# Patient Record
Sex: Male | Born: 1990 | Smoking: Never smoker
Health system: Southern US, Community
[De-identification: ages and names within clinical notes are randomized; demographics above are authoritative.]

## PROBLEM LIST (undated history)

## (undated) DIAGNOSIS — R569 Unspecified convulsions: Secondary | ICD-10-CM

---

## 2019-10-19 ENCOUNTER — Observation Stay (HOSPITAL_COMMUNITY)
Admission: EM | Admit: 2019-10-19 | Discharge: 2019-10-20 | Disposition: A | Discharge status: Home / Self Care | Payer: Self-pay | Attending: Family Medicine | Admitting: Family Medicine

## 2019-10-19 ENCOUNTER — Encounter (HOSPITAL_COMMUNITY): Payer: Self-pay | Admitting: Emergency Medicine

## 2019-10-19 ENCOUNTER — Other Ambulatory Visit: Payer: Self-pay

## 2019-10-19 ENCOUNTER — Emergency Department (HOSPITAL_COMMUNITY): Payer: Self-pay

## 2019-10-19 ENCOUNTER — Observation Stay (HOSPITAL_COMMUNITY): Payer: Self-pay

## 2019-10-19 DIAGNOSIS — R55 Syncope and collapse: Secondary | ICD-10-CM

## 2019-10-19 DIAGNOSIS — Z79899 Other long term (current) drug therapy: Secondary | ICD-10-CM | POA: Insufficient documentation

## 2019-10-19 DIAGNOSIS — Z87898 Personal history of other specified conditions: Secondary | ICD-10-CM

## 2019-10-19 DIAGNOSIS — E1165 Type 2 diabetes mellitus with hyperglycemia: Secondary | ICD-10-CM

## 2019-10-19 DIAGNOSIS — E119 Type 2 diabetes mellitus without complications: Principal | ICD-10-CM | POA: Insufficient documentation

## 2019-10-19 DIAGNOSIS — R569 Unspecified convulsions: Secondary | ICD-10-CM

## 2019-10-19 DIAGNOSIS — Z20822 Contact with and (suspected) exposure to covid-19: Secondary | ICD-10-CM | POA: Insufficient documentation

## 2019-10-19 HISTORY — DX: Unspecified convulsions: R56.9

## 2019-10-19 LAB — COMPREHENSIVE METABOLIC PANEL
ALT: 49 U/L — ABNORMAL HIGH (ref 0–44)
AST: 37 U/L (ref 15–41)
Albumin: 3.8 g/dL (ref 3.5–5.0)
Alkaline Phosphatase: 46 U/L (ref 38–126)
Anion gap: 13 (ref 5–15)
BUN: 7 mg/dL (ref 6–20)
CO2: 22 mmol/L (ref 22–32)
Calcium: 9.7 mg/dL (ref 8.9–10.3)
Chloride: 102 mmol/L (ref 98–111)
Creatinine, Ser: 0.91 mg/dL (ref 0.61–1.24)
GFR calc Af Amer: >60 mL/min (ref >60)
GFR calc non Af Amer: >60 mL/min (ref >60)
Glucose, Bld: 215 mg/dL — ABNORMAL HIGH (ref 70–99)
Potassium: 4.1 mmol/L (ref 3.5–5.1)
Sodium: 137 mmol/L (ref 135–145)
Total Bilirubin: 0.6 mg/dL (ref 0.3–1.2)
Total Protein: 7.4 g/dL (ref 6.5–8.1)

## 2019-10-19 LAB — CBC WITH DIFFERENTIAL/PLATELET
Abs Immature Granulocytes: 0.02 10*3/uL (ref 0.00–0.07)
Basophils Absolute: 0 10*3/uL (ref 0.0–0.1)
Basophils Relative: 0 %
Eosinophils Absolute: 0.1 10*3/uL (ref 0.0–0.5)
Eosinophils Relative: 2 %
HCT: 47.7 % (ref 39.0–52.0)
Hemoglobin: 15.7 g/dL (ref 13.0–17.0)
Immature Granulocytes: 0 %
Lymphocytes Relative: 40 %
Lymphs Abs: 2 10*3/uL (ref 0.7–4.0)
MCH: 28.9 pg (ref 26.0–34.0)
MCHC: 32.9 g/dL (ref 30.0–36.0)
MCV: 87.7 fL (ref 80.0–100.0)
Monocytes Absolute: 0.4 10*3/uL (ref 0.1–1.0)
Monocytes Relative: 7 %
Neutro Abs: 2.5 10*3/uL (ref 1.7–7.7)
Neutrophils Relative %: 51 %
Platelets: 309 10*3/uL (ref 150–400)
RBC: 5.44 MIL/uL (ref 4.22–5.81)
RDW: 12.3 % (ref 11.5–15.5)
WBC: 5.1 10*3/uL (ref 4.0–10.5)
nRBC: 0 % (ref 0.0–0.2)

## 2019-10-19 LAB — SARS CORONAVIRUS 2 BY RT PCR (HOSPITAL ORDER, PERFORMED IN ~~LOC~~ HOSPITAL LAB): SARS Coronavirus 2: NEGATIVE

## 2019-10-19 LAB — ETHANOL: Alcohol, Ethyl (B): <10 mg/dL (ref <10)

## 2019-10-19 LAB — CBG MONITORING, ED: Glucose-Capillary: 250 mg/dL — ABNORMAL HIGH (ref 70–99)

## 2019-10-19 LAB — VALPROIC ACID LEVEL: Valproic Acid Lvl: 51 ug/mL (ref 50.0–100.0)

## 2019-10-19 MED ORDER — SODIUM CHLORIDE 0.9 % IV SOLN
75.0000 mL/h | INTRAVENOUS | Status: DC
  Administered 2019-10-19 – 2019-10-20 (×2): 75 mL/h via INTRAVENOUS

## 2019-10-19 MED ORDER — VALPROATE SODIUM 500 MG/5ML IV SOLN
1500.0000 mg | Freq: Once | INTRAVENOUS | Status: DC
  Filled 2019-10-19: qty 15

## 2019-10-19 MED ORDER — SODIUM CHLORIDE 0.9 % IV BOLUS
1000.0000 mL | Freq: Once | INTRAVENOUS | Status: AC
  Administered 2019-10-19: 1000 mL via INTRAVENOUS

## 2019-10-19 MED ORDER — VALPROATE SODIUM 500 MG/5ML IV SOLN
1500.0000 mg | Freq: Once | INTRAVENOUS | Status: AC
  Administered 2019-10-19: 1500 mg via INTRAVENOUS
  Filled 2019-10-19: qty 15

## 2019-10-19 MED ORDER — METOCLOPRAMIDE HCL 5 MG/ML IJ SOLN
10.0000 mg | Freq: Once | INTRAMUSCULAR | Status: AC
  Administered 2019-10-19: 10 mg via INTRAVENOUS
  Filled 2019-10-19: qty 2

## 2019-10-19 MED ORDER — INSULIN ASPART 100 UNIT/ML ~~LOC~~ SOLN: 0.0000 [IU] | Freq: Three times a day (TID) | SUBCUTANEOUS | Status: DC

## 2019-10-19 MED ORDER — VALPROIC ACID 250 MG PO CAPS
750.0000 mg | ORAL_CAPSULE | Freq: Two times a day (BID) | ORAL | Status: DC
  Administered 2019-10-20: 750 mg via ORAL
  Filled 2019-10-19: qty 3

## 2019-10-19 MED ORDER — GADOBUTROL 1 MMOL/ML IV SOLN
10.0000 mL | Freq: Once | INTRAVENOUS | Status: AC | PRN
  Administered 2019-10-19: 10 mL via INTRAVENOUS

## 2019-10-19 MED ORDER — ENOXAPARIN SODIUM 40 MG/0.4ML ~~LOC~~ SOLN
40.0000 mg | SUBCUTANEOUS | Status: DC
  Administered 2019-10-19: 40 mg via SUBCUTANEOUS
  Filled 2019-10-19: qty 0.4

## 2019-10-19 NOTE — Care Plan (Signed)
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  Per ED PA, patient is already back to baseline.  However he reported headache for the last few days for which he went to [facility] and had "brain imaging" done which did not show any abnormality (we do not have those images available for review).  He currently denies any neck stiffness, is afebrile.   Recommendations -After review of above history, I recommended obtaining an MRI brain with and without contrast to look for any acute abnormality.  Low suspicion for meningitis/encephalitis at this point therefore unless MRI brain shows anything concerning, patient would most likely not need a lumbar puncture. -Also recommended checking valproic acid level which was already ordered.  If level is subtherapeutic, consider increasing dose.  However if patient is already therapeutic, do not necessarily feel that adding a second medication is necessary at this point as patient has been seizure-free for many years.  However if patient has another breakthrough seizures, that would be best to add a second AED at that point. -EEG at this point is not needed as patient already has a history of epilepsy and is back to baseline so low suspicion for subclinical seizures. -Also recommend seizure precautions including do not drive till patient is seizure-free for 6 months. -Please call neurology for formal consult if needed.   Annalei Friesz Annabelle Harman

## 2019-10-19 NOTE — ED Triage Notes (Signed)
Pt BIB [facility] from work. Pt went to restroom and had a seizure while in the restroom. Pt postictal upon EMS arrival. A&Ox4. Pt with history of seizures. NAD. VSS.

## 2019-10-19 NOTE — Progress Notes (Signed)
Patient kept turning head and would not stay awake for exam.  Kept falling asleep and turning head.  Nurse notified and images obtained sent.

## 2019-10-19 NOTE — H&P (Addendum)
Family Medicine Teaching [facility] Admission History and Physical Service Pager: [phone]  Patient name: Ethan Edwards Medical record number: [id number] Date of birth: [birth year] Age: 29 y.o. Gender: male  Primary Care Provider: No primary care provider on file. Consultants: Neurology Code Status: Partial code Preferred Emergency Contact:   Chief Complaint: Usually activity  Assessment and Plan: Ethan Edwards is a 29 y.o. male presenting with seizure-like activity.  PMH is significant for seizure disorder.  Seizure-like activity vs syncope Patient reportedly went to the bathroom while at work in a [employer].  He reports he felt hot and passed out.  Unsure if he had any tonic-clonic motions but when EMS found him he was postictal.  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  He was treated for that but has been on valproic acid since that time.  He has not had seizures in many years.  In the emergency department he appeared tired but was arousable.  He was mildly hypertensive.  Neurology was consulted and recommended further evaluation with MRI and EEG.  Did not recommend lumbar puncture.  MRI was not able to be completed because the patient kept falling asleep in the MRI and moving creating motion artifact.  Valproic acid levels were checked and were therapeutic.  Labs were significant for blood glucose of 215 but otherwise within normal limits.  Alcohol negative.  EKG showed sinus rhythm with borderline right axis deviation.  On our evaluation patient's physical exam was significant for mild dizziness upon standing but he was able to ambulate well.  No focal neurologic deficits.  No meningismus.  Differential for this includes breakthrough seizure activity versus vasovagal syncope vs CNS infection. Patient was using the restroom when this occurred.  Patient also reports dizziness upon standing.  This is a  mixed picture given patient does have a history of a seizure disorder and was postictal when EMS arrived and also extremely tired on arrival to the ED. Suspicion for CNS infection is based on 3 days of headache, blurred vision, fatigue, subjective fevers and new seizure activity.  Overall, vitals are reassuring.  We will follow-up with repeat MR brain and consider LP if there is any evidence of infectious process. -Admit to inpatient teaching service with Dr. Manson Passey as attending -Neuro consulted, appreciate recommendations -Increase scheduled valproic acid dose by 30% to 800 mg twice daily -Nonemergent EEG ordered -Reattempt brain MRI when more oriented, consider LP for MR finding suspicious for infection. -Seizure precautions -Vitals per routine -Orthostatic vital signs -Up with assistance -UDS -Continuous cardiac monitoring  -I's and O's -Neurochecks every 4 hours -PT/OT eval and treat -TSH  Hyperglycemia No previous diagnosis of diabetes.  His single blood glucose of 250 suggest diabetes although this can be elevated following seizures. Patient CBG on arrival was 250.  Repeat on CMP was 215. -Check hemoglobin A1c -SSI sensitive  Elevated ALT Mildly elevated ALT on admission.  No obvious liver pathology based on history and physical exam.  No indication for further diagnostic testing at this time. -Monitor trend  FEN/GI: Regular diet Prophylaxis: Lovenox  Disposition: Admit to inpatient teaching service for observation  History of Present Illness:  Ethan Edwards is a 29 y.o. male presenting with seizure-like activity.  He has a previous medical history significant for seizure disorder (possibly from neurocysticercosis).  Per ED documentation the EMS reported he went to the restroom while at work and had  a seizure while in the restroom at work.  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  He was postictal on EMSs arrival.  He reports that he remembers going to the bathroom  and feeling like he was going to pass out.  He then had a warm feeling.  When he woke up he was already being treated by EMS.  Patient reports he has had a headache for approximately 3 days.  He has taken ibuprofen for this headache with mild improvement.  Reports that when he has the headache he does notice mild blurring of his vision.  Patient also had one episode of diarrhea yesterday.  Complains of weakness today and he feels like he gets flushed.  Denies any recent seizure activity.  Reports that he has been compliant with his medication.   Of note patient reports that when he was approximately 29 years old he was diagnosed with "a worm in his head" which caused him to have a seizure disorder.  He has been on valproic acid since that time.  Review Of Systems: Per HPI with the following additions:   Review of Systems  Constitutional: Positive for fever (he feels hot inside). Negative for chills.  Eyes: Positive for visual disturbance (blurred vision with his headache). Negative for photophobia.  Respiratory: Negative for cough and shortness of breath.   Cardiovascular: Positive for chest pain (when he has his headache).  Gastrointestinal: Positive for diarrhea. Negative for abdominal pain, nausea and vomiting.  Musculoskeletal: Negative for myalgias.  Neurological: Positive for seizures, weakness and headaches. Negative for tremors and numbness.     There are no problems to display for this patient.   Past Medical History: Past Medical History:  Diagnosis Date  . Seizures (HCC)     Past Surgical History: History reviewed. No pertinent surgical history.  Social History: Social History   Tobacco Use  . Smoking status: Not on file  Substance Use Topics  . Alcohol use: Not on file  . Drug use: Not on file   Additional social history: Denies tobacco, alcohol or other drug use Please also refer to relevant sections of EMR.  Family History: History reviewed. No pertinent family  history.   Allergies and Medications: No Known Allergies No current facility-administered medications on file prior to encounter.   Current Outpatient Medications on File Prior to Encounter  Medication Sig Dispense Refill  . NON FORMULARY Take 600 mg by mouth 2 (two) times daily. Criam 600mg  tablets; anticonvulsant medication that the patient gets from .      Objective: BP 135/77   Pulse 100   Temp 98.8 F (37.1 C) (Oral)   Resp 15   SpO2 96%  Physical Exam  Constitutional: He is oriented to person, place, and time and well-developed, well-nourished, and in no distress. No distress.  HENT:  Head: Normocephalic and atraumatic.  Mouth/Throat: Oropharynx is clear and moist. No oropharyngeal exudate.  Mild erythema nasal turbinates  Eyes: Pupils are equal, round, and reactive to light. Conjunctivae and EOM are normal. Right eye exhibits no discharge. Left eye exhibits no discharge.  Neck: No tracheal deviation present. No thyromegaly present.  Cardiovascular: Normal rate, regular rhythm, normal heart sounds and intact distal pulses.  No murmur heard. Pulmonary/Chest: Effort normal and breath sounds normal. No respiratory distress. He has no wheezes. He has no rales.  Abdominal: Soft. Bowel sounds are normal. He exhibits no distension. There is abdominal tenderness (Mild tenderness in epigastric area).  Musculoskeletal:  General: No tenderness, deformity or edema. Normal range of motion.     Cervical back: Normal range of motion and neck supple.  Lymphadenopathy:    He has no cervical adenopathy.  Neurological: He is alert and oriented to person, place, and time. No cranial nerve deficit. Gait normal. Coordination normal.  Skin: Skin is warm and dry. No rash noted. He is not diaphoretic. No erythema.   Labs and Imaging: CBC BMET  Recent Labs  Lab 10/19/19 1418  WBC 5.1  HGB 15.7  HCT 47.7  PLT 309   Recent Labs  Lab 10/19/19 1418  NA 137  K 4.1  CL 102   CO2 22  BUN 7  CREATININE 0.91  GLUCOSE 215*  CALCIUM 9.7     EKG: Sinus tach with borderline right axis deviation  Gifford Shave, MD 10/19/2019, 5:44 PM PGY-1, [place] [facility] Intern pager: [phone], text pages welcome  FPTS Upper-Level Resident Addendum   I have independently interviewed and examined the patient. I have discussed the above with the original author and agree with their documentation. My edits for correction/addition/clarification are in blue. Please see also any attending notes.    Matilde Haymaker MD PGY-2, [facility] 10/19/2019 8:16 PM  [facility] pager: [phone] (text pages welcome through [place])

## 2019-10-19 NOTE — Consult Note (Addendum)
NEURO HOSPITALIST CONSULT NOTE   Requesting physician: Dr. Charm Barges  Reason for Consult: Breakthrough seizure  History obtained from:  Chart     HPI:                                                                                                                                         Ethan Edwards is a 29 y.o. Spanish-speaking male with a PMHx of seizures who presented to the [facility] ED with seizures.  Patient was in the porta potty at work where he felt hot and then passed out. Unsure if he had any tonic clonic seizure activity, but when EMS found him he was posit ictal. Patient does have a history of seizure disorder which he said is from "worms in my brain" when he was a child in [place]. 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 He stated that he was diagnosed with seizures at age 51. The patient is currently on valproic acid for his seizures, which has been therapeutic - the patient has not had seizures for many years. history and physical assessment done using interpreter Ipad interpreter # [phone]  Hospital course: MRI: motion degraded exam reveals no focal lesion, including no visible chronic lesion to confirm prior neurocysticercosis. Post-contrast images are negative for abnormal enhancement.  Valproic acid level: 51 (low end of therapeutic) BG: 215  BP: 130/84  Past Medical History:  Diagnosis Date  . Seizures (HCC)     History reviewed. No pertinent surgical history.  History reviewed. No pertinent family history.            Social History:  has no history on file for tobacco, alcohol, and drug.  No Known Allergies  MEDICATIONS:                                                                                                                     Scheduled: . enoxaparin (LOVENOX) injection  40 mg Subcutaneous Q24H  . insulin aspart  0-9 Units Subcutaneous TID WC  .  valproic acid  750 mg Oral BID   Continuous: . sodium chloride 75 mL/hr (10/19/19 2221)   ROS:  ROS was performed and is negative except as noted in HPI  Blood pressure 135/77, pulse 100, temperature 98.8 F (37.1 C), temperature source Oral, resp. rate 15, SpO2 96 %.   General Examination:                                                                                                       Physical Exam  HEENT-  Normocephalic.  Normal external eye and conjunctiva.   Cardiovascular-  pulses palpable throughout   Lungs- no excessive work of breathing.  Saturations within normal limits Abdomen- All 4 quadrants palpated and nontender Extremities- Warm, dry and intact Musculoskeletal-no joint tenderness, deformity or swelling Skin-warm and dry, no hyperpigmentation, vitiligo, or suspicious lesions  Neurological Examination Mental Status:  Neurology NP exam: Drowsy, requires repeated stimulation for exam. No dysarthria present. No aphasia. Able to follow commands Neurology Attending exam: No longer drowsy. Stays awake for entire interview and exam. Speech fluent with intact comprehension. Good recall of PMHx.  Cranial Nerves: VFF, midline tongue extension. Face appears symmetric. PERRL, EOMI Motor: Able to raise all 4 limbs anti gravity 5/5 strength of upper and lower extremities without asymmetry Normal tone throughout; no atrophy noted Sensory:  Light touch intact throughout, bilaterally Deep Tendon Reflexes: 2+ and symmetric throughout Cerebellar: No ataxia noted Gait: Deferred   Lab Results: Basic Metabolic Panel: Recent Labs  Lab 10/19/19 1418  NA 137  K 4.1  CL 102  CO2 22  GLUCOSE 215*  BUN 7  CREATININE 0.91  CALCIUM 9.7    CBC: Recent Labs  Lab 10/19/19 1418  WBC 5.1  NEUTROABS 2.5  HGB 15.7  HCT 47.7  MCV 87.7  PLT 309     Imaging: MR BRAIN WO CONTRAST  Result Date: 10/19/2019 CLINICAL DATA:  Seizure. History of neurocysticercosis with new headache and seizure. EXAM: MRI HEAD WITHOUT CONTRAST TECHNIQUE: Multiplanar, multiecho pulse sequences of the brain and surrounding structures were obtained without intravenous contrast. COMPARISON:  None. FINDINGS: Incomplete study. Patient had difficulty holding still and then was not able to complete the study. Diffusion-weighted imaging is diagnostic and negative for acute infarct. Ventricle size normal. No infarct hemorrhage or mass. No edema identified. Normal arterial flow voids. Mild mucosal edema paranasal sinuses.  Negative orbit. IMPRESSION: Incomplete study which is degraded by motion. Allowing for this no significant abnormality detected in the brain. Electronically Signed   By: Franchot Gallo M.D.   On: 10/19/2019 16:16   Laurey Morale, MSN, NP-C [facility] [phone]   Assessment: 29 year old male with probable breakthrough seizure. Initially was postictal. Now is fully awake. Prior history of neurocysticercosis diagnosed as a child in [place].  1. Valproic acid level was 51, at the low end of the therapeutic range.  2. Exam earlier today revealed a very drowsy/lethargic patient, requiring repeated stimulation to stay awake. Was able to follow commands and was oriented to all. On follow up exam by attending, the patient was no longer postictal, fully awake, with fluent speech and no focal abnormalities on exam.  3. MRI brain with  and without contrast is normal (required a second scan to complete post-contrast images). 4. EEG is normal. No seizures or epileptiform discharges are seen throughout the recording 5. HgbA1c elevated at 7.2  Recommendations: 1. Supplemental load of IV valproic acid 1500 mg x 1 was administered in the ED yesterday.   2. Increased scheduled valproic acid by 30% to 800 mg BID yesterday. The patient is tolerating the  increased dose well.  3. Primary team will need to inform the patient of outpatient seizure precautions: Per [facility] statutes, patients with seizures are not allowed to drive until  they have been seizure-free for six months. Use caution when using heavy equipment or power tools. Avoid working on ladders or at heights. Take showers instead of baths. Ensure the water temperature is not too high on the home water heater. Do not go swimming alone. When caring for infants or small children, sit down when holding, feeding, or changing them to minimize risk of injury to the child in the event you have a seizure. Also, Maintain good sleep hygiene. Avoid alcohol. 4. From a Neurological standpoint the patient can be discharged home if cleared by PT. He will need outpatient Neurology follow up.  5. Neurology will sign off. Please call if there are additional questions.   Electronically signed: Dr. Caryl Pina

## 2019-10-19 NOTE — ED Provider Notes (Signed)
[facility] EMERGENCY DEPARTMENT Provider Note   CSN: [id number] Arrival date & time: 10/19/19  1342     History Chief Complaint  Patient presents with  . Seizures    Ethan Edwards is a 29 y.o. male brought to the ER by EMS for evaluation of possible seizure.  Per EMS report patient went to the restroom at work and had a seizure while in the restroom.  Postictal upon EMS arrival.  Patient is Spanish-speaking only.  He declined formal interpreter.  I obtained history directly from patient.  States he suspects he had a seizure but is not sure.  He remembers going to the bathroom and feeling like he was going to pass out.  Reports current generalized "warm" feeling, mild generalized headache and feeling very tired.  States 5 days ago he had a gradual onset generalized headache, it slowly worsened in severity.  Eventually became severe, could not tolerate it and he went to [facility] ER where he had a "brain scan" and lab work, he was told everything was fine and discharged home.  States 2 days ago a Mudlogger gave him 2 capsules to help with a headache, he is not sure what the medicine was.  His headache has been persistent but has gotten slightly better.  No anticoagulants.  Denies double vision.  Reports occasional blurred vision but none currently.  No nausea, vomiting.  No one-sided weakness or tingling.  Denies alcohol or illicit drug use.  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  He lived in [place] at that time.  He has history of seizures but states his last seizure was several years ago and cannot remember exact year.  He takes "Criam" 600 mg twice daily that he obtains from [place].  Denies recent missing doses.  Denies recent illnesses.  He does not have a primary care doctor or neurologist here. No other known PMH.   HPI     Past Medical History:  Diagnosis Date  . Seizures ([place])      There are no problems to display for this patient.   History reviewed. No pertinent surgical history.     History reviewed. No pertinent family history.  Social History   Tobacco Use  . Smoking status: Not on file  Substance Use Topics  . Alcohol use: Not on file  . Drug use: Not on file    Home Medications Prior to Admission medications   Medication Sig Start Date End Date Taking? Authorizing Provider  NON FORMULARY Take 600 mg by mouth 2 (two) times daily. Criam 600mg  tablets; anticonvulsant medication that the patient gets from [place].   Yes [provider]    Allergies    Patient has no known allergies.  Review of Systems   Review of Systems  Constitutional: Positive for fatigue.  Neurological: Positive for seizures and headaches.  All other systems reviewed and are negative.   Physical Exam Updated Vital Signs BP 120/64   Pulse 86   Temp 98.8 F (37.1 C) (Oral)   Resp 12   SpO2 97%   Physical Exam Vitals and nursing note reviewed.  Constitutional:      General: He is not in acute distress.    Appearance: He is well-developed.     Comments: NAD. Obese.  Appears tired but stays awake during conversation.   HENT:     Head: Normocephalic and  atraumatic.     Comments: No signs of facial or scalp trauma.    Right Ear: External ear normal.     Left Ear: External ear normal.     Nose: Congestion present.     Comments: Sounds congested     Mouth/Throat:     Comments: No intraoral or tongue injury. Eyes:     General: No scleral icterus.    Conjunctiva/sclera: Conjunctivae normal.  Cardiovascular:     Rate and Rhythm: Normal rate and regular rhythm.     Heart sounds: Normal heart sounds. No murmur.  Pulmonary:     Effort: Pulmonary effort is normal.     Breath sounds: Normal breath sounds. No wheezing.  Musculoskeletal:        General: No deformity. Normal range of motion.     Cervical back: Normal range of motion and neck supple.   Skin:    General: Skin is warm and dry.     Capillary Refill: Capillary refill takes less than 2 seconds.  Neurological:     Mental Status: He is alert and oriented to person, place, and time.     Comments: Alert and oriented to self, place, time and event.  Speech is fluent without dysarthria or dysphasia. Strength 5/5 with hand grip and ankle F/E.   Sensation to light touch intact in face, hands and feet. Sits on side of the bed without truncal sway No pronator drift. No leg drop. Normal finger-to-nose.  CN I not tested CN II grossly intact visual fields bilaterally. Unable to visualize posterior eye. CN III, IV, VI PEERL and EOMs intact bilaterally CN V light touch intact in all 3 divisions of trigeminal nerve CN VII facial movements symmetric CN VIII not tested CN IX, X no uvula deviation, symmetric rise of soft palate  CN XI 5/5 SCM and trapezius strength bilaterally  CN XII Midline tongue protrusion, symmetric L/R movements  Psychiatric:        Behavior: Behavior normal.        Thought Content: Thought content normal.        Judgment: Judgment normal.     ED Results / Procedures / Treatments   Labs (all labs ordered are listed, but only abnormal results are displayed) Labs Reviewed  COMPREHENSIVE METABOLIC PANEL - Abnormal; Notable for the following components:      Result Value   Glucose, Bld 215 (*)    ALT 49 (*)    All other components within normal limits  CBG MONITORING, ED - Abnormal; Notable for the following components:   Glucose-Capillary 250 (*)    All other components within normal limits  SARS CORONAVIRUS 2 BY RT PCR (HOSPITAL ORDER, PERFORMED IN [facility])  CBC WITH DIFFERENTIAL/PLATELET  ETHANOL  VALPROIC ACID LEVEL    EKG EKG Interpretation  Date/Time:  Friday October 19 2019 13:47:42 EDT Ventricular Rate:  102 PR Interval:    QRS Duration: 98 QT Interval:  335 QTC Calculation: 437 R Axis:   99 Text Interpretation: Sinus  tachycardia Borderline right axis deviation ST elevation, consider anterolateral injury No old tracing to compare Confirmed by Meridee Score [phone]) on 10/19/2019 1:50:12 PM   Radiology MR BRAIN WO CONTRAST  Result Date: 10/19/2019 CLINICAL DATA:  Seizure. History of neurocysticercosis with new headache and seizure. EXAM: MRI HEAD WITHOUT CONTRAST TECHNIQUE: Multiplanar, multiecho pulse sequences of the brain and surrounding structures were obtained without intravenous contrast. COMPARISON:  None. FINDINGS: Incomplete study. Patient had difficulty holding still and  then was not able to complete the study. Diffusion-weighted imaging is diagnostic and negative for acute infarct. Ventricle size normal. No infarct hemorrhage or mass. No edema identified. Normal arterial flow voids. Mild mucosal edema paranasal sinuses.  Negative orbit. IMPRESSION: Incomplete study which is degraded by motion. Allowing for this no significant abnormality detected in the brain. Electronically Signed   By: Marlan Palau M.D.   On: 10/19/2019 16:16    Procedures Procedures (including critical care time)  Medications Ordered in ED Medications  valproate (DEPACON) injection 1,500 mg (has no administration in time range)  sodium chloride 0.9 % bolus 1,000 mL (1,000 mLs Intravenous New Bag/Given 10/19/19 1442)  metoCLOPramide (REGLAN) injection 10 mg (10 mg Intravenous Given 10/19/19 1512)    ED Course  I have reviewed the triage vital signs and the nursing notes.  Pertinent labs & imaging results that were available during my care of the patient were reviewed by me and considered in my medical decision making (see chart for details).  Clinical Course as of Oct 19 1802  Fri Oct 19, 2019  1600 Valproic Acid,S: 51 [CG]  1625 IMPRESSION: Incomplete study which is degraded by motion. Allowing for this no significant abnormality detected in the brain.     [CG]  1638 Spoke to MRI tech states patient was falling asleep and  couldn't give contrast to  obtain images   [CG]  1749 Patient re-evaluated - still sleepy but wakes up to voice and carries a conversation.  I spoke to neurology Dr Otelia Limes who agrees with admission for prolonged post ictal state.  He now recommends valproic acid bolus here and increased daily dose to 800 mg BID. He will order EEG.  Will need MRI with contrast while admission. Neurology will see for formal consult.    [CG]    Clinical Course User Index [CG] Jerrell Mylar   MDM Rules/Calculators/A&P                      29 year old male with reported history of seizures on valproic acid and remote history of what sounds like neurocysticercosis brought to the ER for evaluation of possible seizure.  Postictal per EMS.  Has been compliant with medicines.  Denies alcohol or illicit drug use.  On exam he is slightly hypertensive, and appears tired but stays awake during conversation.  No neuro deficits.  No signs of significant facial or scalp or cervical spine trauma.  Spoke to epileptologist to obtain recommendations given his complex past medical history, she recommends MRI with and without contrast to evaluate for new lesions or signs of infection.  Does not think lumbar puncture will be necessary.  Does not think emergent EEG would be beneficial at this time.  Recommends following up on valproic acid levels.   1634: MRI was not able to be completed, MRI tech states patient kept falling asleep during exam.  However, no significant abnormalities detected in the brain.  Valproic acid appears to be therapeutic.  Patient reevaluated, is found asleep but easily arousable.  States he is very tired but is fully oriented.  Questionable prolonged postictal state requiring admission.  We will consult neurology for further recommendations.    1750: Reconsulted neurology, see above.  Will consult unassigned medicine team for admission for prolonged post-ictal state. Patient given bolus of  antiepileptic here, reglan for headache and IVF.    Patient has occasional desaturations when laying supine and asleep, ?OSA.   Re-evaluated patient and no  clinical decline, still sleepy.  Final Clinical Impression(s) / ED Diagnoses Final diagnoses:  History of seizures    Rx / DC Orders ED Discharge Orders    None       Jerrell Mylar 10/19/19 1805    Terrilee Files, MD 10/19/19 1956

## 2019-10-19 NOTE — ED Notes (Signed)
Pt transported to MRI 

## 2019-10-19 NOTE — ED Notes (Signed)
Patient transported to MRI 

## 2019-10-20 ENCOUNTER — Observation Stay (HOSPITAL_COMMUNITY): Payer: Self-pay

## 2019-10-20 ENCOUNTER — Encounter (HOSPITAL_COMMUNITY): Payer: Self-pay | Admitting: Family Medicine

## 2019-10-20 DIAGNOSIS — R569 Unspecified convulsions: Secondary | ICD-10-CM

## 2019-10-20 DIAGNOSIS — Z87898 Personal history of other specified conditions: Secondary | ICD-10-CM

## 2019-10-20 DIAGNOSIS — R55 Syncope and collapse: Secondary | ICD-10-CM

## 2019-10-20 DIAGNOSIS — E1165 Type 2 diabetes mellitus with hyperglycemia: Secondary | ICD-10-CM

## 2019-10-20 LAB — COMPREHENSIVE METABOLIC PANEL
ALT: 48 U/L — ABNORMAL HIGH (ref 0–44)
AST: 30 U/L (ref 15–41)
Albumin: 3.5 g/dL (ref 3.5–5.0)
Alkaline Phosphatase: 35 U/L — ABNORMAL LOW (ref 38–126)
Anion gap: 10 (ref 5–15)
BUN: 6 mg/dL (ref 6–20)
CO2: 29 mmol/L (ref 22–32)
Calcium: 9.3 mg/dL (ref 8.9–10.3)
Chloride: 102 mmol/L (ref 98–111)
Creatinine, Ser: 0.75 mg/dL (ref 0.61–1.24)
GFR calc Af Amer: >60 mL/min (ref >60)
GFR calc non Af Amer: >60 mL/min (ref >60)
Glucose, Bld: 125 mg/dL — ABNORMAL HIGH (ref 70–99)
Potassium: 3.9 mmol/L (ref 3.5–5.1)
Sodium: 141 mmol/L (ref 135–145)
Total Bilirubin: 0.7 mg/dL (ref 0.3–1.2)
Total Protein: 6.8 g/dL (ref 6.5–8.1)

## 2019-10-20 LAB — HEMOGLOBIN A1C
Hgb A1c MFr Bld: 7.2 % — ABNORMAL HIGH (ref 4.8–5.6)
Mean Plasma Glucose: 159.94 mg/dL

## 2019-10-20 LAB — HIV ANTIBODY (ROUTINE TESTING W REFLEX): HIV Screen 4th Generation wRfx: NONREACTIVE

## 2019-10-20 LAB — TSH: TSH: 1.072 u[IU]/mL (ref 0.350–4.500)

## 2019-10-20 LAB — GLUCOSE, CAPILLARY
Glucose-Capillary: 129 mg/dL — ABNORMAL HIGH (ref 70–99)
Glucose-Capillary: 111 mg/dL — ABNORMAL HIGH (ref 70–99)
Glucose-Capillary: 96 mg/dL (ref 70–99)

## 2019-10-20 MED ORDER — METFORMIN HCL ER 500 MG PO TB24
500.0000 mg | ORAL_TABLET | Freq: Every day | ORAL | Status: DC
  Filled 2019-10-20: qty 1

## 2019-10-20 MED ORDER — ACETAMINOPHEN 325 MG PO TABS: 650.0000 mg | ORAL_TABLET | Freq: Four times a day (QID) | ORAL | Status: DC | PRN

## 2019-10-20 MED ORDER — VALPROIC ACID 250 MG PO CAPS: 750.0000 mg | ORAL_CAPSULE | Freq: Two times a day (BID) | ORAL | 3 refills | Status: AC

## 2019-10-20 MED ORDER — METFORMIN HCL ER 500 MG PO TB24: 500.0000 mg | ORAL_TABLET | Freq: Every day | ORAL | 3 refills | Status: AC

## 2019-10-20 NOTE — Procedures (Signed)
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 EEG to evaluate for seizure  Level of alertness: Awake, asleep  AEDs during EEG study: VPA  Technical aspects: This EEG study was done with scalp electrodes positioned according to the 10-20 International system of electrode placement. Electrical activity was acquired at a sampling rate of 500Hz  and reviewed with a high frequency filter of 70Hz  and a low frequency filter of 1Hz . EEG data were recorded continuously and digitally stored.   Description: The posterior dominant rhythm consists of 8 Hz activity of moderate voltage (25-35 uV) seen predominantly in posterior head regions, symmetric and reactive to eye opening and eye closing. Sleep was characterized by vertex waves, sleep spindles (12 to 14 Hz), maximal frontocentral region.  Physiology photic driving was not seen during photic stimulation.  Hyperventilation was not performed.     IMPRESSION: This study is within normal limits. No seizures or epileptiform discharges were seen throughout the recording.  Latrenda Irani 

## 2019-10-20 NOTE — Evaluation (Signed)
Physical Therapy Evaluation Patient Details Name: Ethan Edwards MRN: [id number] DOB: 1991-04-22 Today's Date: 10/20/2019   History of Present Illness  Pt is a 29 yo spanish-speaking male presenting after a possible seizure at work following 5 days of gradually worsening headache. Pt is currently awaiting repeat MRI imaging due to artifact in original images. PMH includes seizure disorder.  Clinical Impression  Pt in bed upon arrival of PT, initially lethargic but agreeable to evaluation at this time. 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 The pt now presents with minor limitations in functional mobility and dynamic stability due to above dx, and is safe to d/c home with supervision but will continue to benefit from skilled PT to address these deficits acutely. The pt was able to demo short ambulation in room without use of AD or LOB, and longer ambulation in the hallway where he pushed the IV pole. The pt was able to demo good safety awareness with mobility, and reports he feels his mobility is at his baseline. Due to lethargy and initial reports of dizziness, stair training was deferred at this time due to safety concerns, but the pt was able to demo good mobility in the room without concern for LOB but minG for safety.     Follow Up Recommendations No PT follow up;Supervision/Assistance - 24 hour    Equipment Recommendations  None recommended by PT    Recommendations for Other Services       Precautions / Restrictions Precautions Precautions: Fall Precaution Comments: seizure Restrictions Weight Bearing Restrictions: No      Mobility  Bed Mobility Overal bed mobility: Modified Independent                Transfers Overall transfer level: Needs assistance   Transfers: Sit to/from Stand Sit to Stand: Supervision         General transfer comment: supervision for safety, pt able to rise  without AD or assist. slow movements no LOB or change in dizziness  Ambulation/Gait Ambulation/Gait assistance: Min guard Gait Distance (Feet): 20 Feet(+ 45 ft) Assistive device: IV Pole Gait Pattern/deviations: Step-through pattern;Decreased stride length;Trunk flexed Gait velocity: decreased Gait velocity interpretation: 1.31 - 2.62 ft/sec, indicative of limited community ambulator General Gait Details: pt with slow shortened gait but no LOB. Pushed IV pole due to difficulty managing interpreter and IV pole. but able to demo short ambulation in room without AD with wide BOS but no LOB. Pt reports his ambulation feels like his baseline  Stairs            Wheelchair Mobility    Modified Rankin (Stroke Patients Only)       Balance Overall balance assessment: Mild deficits observed, not formally tested                             High Level Balance Comments: Pt able to retrive items from floor with good technique and no LOB, minG for safety due to initial lethargy             Pertinent Vitals/Pain Pain Assessment: No/denies pain    Home Living Family/patient expects to be discharged to:: Private residence Living Arrangements: Parent Available Help at Discharge: Family;Available PRN/intermittently Type of Home: Apartment Home Access: Stairs to enter Entrance Stairs-Rails: Lawyer of Steps: 10 Home Layout: One level   Additional Comments: pt reports both parents work,  stated he could arrange a friend or family member to supervise for a few days after d/c    Prior Function Level of Independence: Independent         Comments: pt works Geophysicist/field seismologist [employer]   Dominant Hand: Right    Extremity/Trunk Assessment   Upper Extremity Assessment Upper Extremity Assessment: Overall WFL for tasks assessed    Lower Extremity Assessment Lower Extremity Assessment: Overall WFL for  tasks assessed    Cervical / Trunk Assessment Cervical / Trunk Assessment: Normal  Communication   Communication: Prefers language other than English(spanish)  Cognition Arousal/Alertness: Lethargic Behavior During Therapy: WFL for tasks assessed/performed;Flat affect Overall Cognitive Status: Within Functional Limits for tasks assessed                                 General Comments: Pt with increased time for all responses (even to interpreter) but agreeable and able to follow 2-step directions. Pt with good safety awareness and slow movements once more alert.      General Comments General comments (skin integrity, edema, etc.): interpreter service usedBarnie Alderman [phone]    Exercises     Assessment/Plan    PT Assessment Patient needs continued PT services  PT Problem List Decreased mobility;Decreased safety awareness;Decreased activity tolerance;Decreased balance       PT Treatment Interventions Gait training;Stair training;Functional mobility training;Therapeutic activities;Patient/family education;Balance training;Therapeutic exercise    PT Goals (Current goals can be found in the Care Plan section)  Acute Rehab PT Goals Patient Stated Goal: return home and get some sleep PT Goal Formulation: With patient Time For Goal Achievement: 11/03/19 Potential to Achieve Goals: Good    Frequency Min 3X/week   Barriers to discharge Decreased caregiver support pt reports both parents work, but stated he could arrange someone for supervision for a few days at home    Co-evaluation               AM-PAC PT "6 Clicks" Mobility  Outcome Measure Help needed turning from your back to your side while in a flat bed without using bedrails?: None Help needed moving from lying on your back to sitting on the side of a flat bed without using bedrails?: None Help needed moving to and from a bed to a chair (including a wheelchair)?: A Little Help needed standing up from a  chair using your arms (e.g., wheelchair or bedside chair)?: None Help needed to walk in hospital room?: A Little Help needed climbing 3-5 steps with a railing? : A Little 6 Click Score: 21    End of Session Equipment Utilized During Treatment: Gait belt(interpreter) Activity Tolerance: Patient tolerated treatment well Patient left: in bed;with call bell/phone within reach;with bed alarm set Nurse Communication: Mobility status PT Visit Diagnosis: Unsteadiness on feet (R26.81);Other (comment)(seizures)    Time: 3267-1245 PT Time Calculation (min) (ACUTE ONLY): 29 min   Charges:   PT Evaluation $PT Eval Moderate Complexity: 1 Mod PT Treatments $Gait Training: 8-22 mins        Karma Ganja, PT, DPT   Acute Rehabilitation Department Pager #: [phone]   Otho Bellows 10/20/2019, 9:02 AM

## 2019-10-20 NOTE — Evaluation (Signed)
Occupational Therapy Evaluation Patient Details Name: Ethan Edwards MRN: [id number] DOB: 04-Dec-1990 Today's Date: 10/20/2019    History of Present Illness Pt is a 29 yo spanish-speaking male presenting after a possible seizure at work following 5 days of gradually worsening headache. Pt is currently awaiting repeat MRI imaging due to artifact in original images. PMH includes seizure disorder.   Clinical Impression   Pt PTA: Pt living with parents, works in Architect. Pt reports independence with ADL and mobility prior. Pt currently with deficits in cognition. Pt requiring increased time with difficulty with recall, problem solving and increased processing delay present. Under cognitive section, please see continued assessment of cognition. Pt followed all commands with increased time and physically performed all ADL and mobility without physical assist. Pt requires interpreter as he is Spanish speaking only. Pt would benefit from continued OT skilled services for cognition and to assess vision. OT following acutely.      Follow Up Recommendations  Outpatient OT;Other (comment)(OP OT for higher level cognition may progress to not need it)    Equipment Recommendations  None recommended by OT    Recommendations for Other Services       Precautions / Restrictions Precautions Precautions: Fall Precaution Comments: seizure Restrictions Weight Bearing Restrictions: No      Mobility Bed Mobility Overal bed mobility: Modified Independent                Transfers Overall transfer level: Needs assistance   Transfers: Sit to/from Stand Sit to Stand: Supervision         General transfer comment: No physical assist. Pt able to rise without AD.    Balance Overall balance assessment: No apparent balance deficits (not formally assessed)                             High Level Balance Comments: Picking items from floor, opening doors and turning around in  circle; backing up to bed.           ADL either performed or assessed with clinical judgement   ADL Overall ADL's : At baseline;Modified independent                                       General ADL Comments: Pt performing LB dressing, grooming at sink; standing for ADL tasks and able to pick items from floor. Pt required 2 cues to use cup for water instead of hand for water, but OTR may assume that pt prefers using hands.     Vision Baseline Vision/History: No visual deficits Patient Visual Report: No change from baseline Additional Comments: appears, WFLs. Pt sore of unfocused to perform     Perception     Praxis      Pertinent Vitals/Pain Pain Assessment: No/denies pain     Hand Dominance Right   Extremity/Trunk Assessment Upper Extremity Assessment Upper Extremity Assessment: Overall WFL for tasks assessed   Lower Extremity Assessment Lower Extremity Assessment: Overall WFL for tasks assessed   Cervical / Trunk Assessment Cervical / Trunk Assessment: Normal   Communication Communication Communication: Prefers language other than English(spanish)   Cognition Arousal/Alertness: Awake/alert;Lethargic Behavior During Therapy: WFL for tasks assessed/performed;Flat affect Overall Cognitive Status: Difficult to assess Area of Impairment: Memory;Problem solving                     Memory:  Decreased short-term memory         General Comments: Pt requiring increased time with difficulty with recall, problem solving and increased processing delay present. Mini cognitive assessment for counting backwards from 20-1 (which he had multiple mistakes getting stuck on 16, saying 14 3x and skipping 2); able to tell time without looking at clock or watch within the hour; pt unable to spell "[place]" backwards. 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 OTR unable to decipher if  cognitive deficits are new or not.   General Comments  Interpreter usedKendell Edwards [phone]    Exercises     Shoulder Instructions      Home Living Family/patient expects to be discharged to:: Private residence Living Arrangements: Parent Available Help at Discharge: Family;Available PRN/intermittently Type of Home: Apartment Home Access: Stairs to enter Entrance Stairs-Number of Steps: 10 Entrance Stairs-Rails: Left;Right Home Layout: One level     Bathroom Shower/Tub: Producer, television/film/video: Handicapped height     Home Equipment: None   Additional Comments: pt reports both parents work, stated he could arrange a friend or family member to supervise for a few days after d/c      Prior Functioning/Environment Level of Independence: Independent        Comments: pt works Chief Financial Officer        OT Problem List: Decreased cognition      OT Treatment/Interventions: Self-care/ADL training;Cognitive remediation/compensation;Visual/perceptual remediation/compensation;Patient/family education    OT Goals(Current goals can be found in the care plan section) Acute Rehab OT Goals Patient Stated Goal: return home and get some sleep OT Goal Formulation: With patient Time For Goal Achievement: 11/03/19 Potential to Achieve Goals: Good ADL Goals Additional ADL Goal #1: Pt will complete (3) higher level cognitive tasks with minimal cues to continue to assess safety and problem solving. Additional ADL Goal #2: Pt will participate and be able to verbalize visual perceptual deficit tasks, if any with no verbal cues to complete task.  OT Frequency: Min 2X/week   Barriers to D/C:            Co-evaluation              AM-PAC OT "6 Clicks" Daily Activity     Outcome Measure Help from another person eating meals?: None Help from another person taking care of personal grooming?: None Help from another person toileting, which includes  using toliet, bedpan, or urinal?: None Help from another person bathing (including washing, rinsing, drying)?: None Help from another person to put on and taking off regular upper body clothing?: None Help from another person to put on and taking off regular lower body clothing?: None 6 Click Score: 24   End of Session Equipment Utilized During Treatment: Gait belt;Other (comment)(interpreter) Nurse Communication: Mobility status;Other (comment)(cognitive eval for SLP)  Activity Tolerance: Patient tolerated treatment well Patient left: in bed;with call bell/phone within reach;with bed alarm set  OT Visit Diagnosis: Unsteadiness on feet (R26.81);Other symptoms and signs involving cognitive function                Time: 2353-6144 OT Time Calculation (min): 30 min Charges:  OT General Charges $OT Visit: 1 Visit OT Evaluation $OT Eval Moderate Complexity: 1 Mod OT Treatments $Cognitive Funtion inital: Initial 15 mins  Flora Lipps, OTR/L Acute Rehabilitation Services Pager: [phone] Office: [phone]   Ethan Edwards C 10/20/2019, 10:08 AM

## 2019-10-20 NOTE — Discharge Summary (Signed)
Family Medicine Teaching [facility] Discharge Summary  Patient name: Ethan Edwards Medical record number: [id number] Date of birth: 25-Dec-1990 Age: 29 y.o. Gender: male Date of Admission: 10/19/2019  Date of Discharge: 10/20/2019 Admitting Physician: Derrel Nip, MD  Primary Care Provider: No primary care provider on file. Consultants: Neuro  Indication for Hospitalization: Syncopal Episode vs Seizure  Discharge Diagnoses/Problem List:  T2DM, new diagnosis Seizures  Disposition: Discharge home  Discharge Condition: Stable, improved  Discharge Exam:  General: well-appearing male, nontoxic appearing Cardiac: RRR, S1-S2 present, no murmurs appreciated Respiratory: CTA bilaterally, comfortable work of breathing -Integumentary: Acanthosis nigricans appreciated to patient's posterior neck Neuro: Alert and oriented x3, cranial nerves II-XII grossly intact, no focal neurological deficits   Brief Hospital Course:  Patient was admitted to [facility] on 10/19/2019 after he went to use the bathroom outside in a porta potty while at work, felt hot and passed out.  There were no reported tonic-clonic motions, however EMS reported the patient was post ictal.  Patient does have a known seizure disorder, although he has not had any seizures in many years.  Neurology was consulted and recommended further evaluation with MRI and EEG, both were negative.  He was treated with valproic acid, his valproic acid level was increased from 600 up to 750 mg twice daily (neurology recommended increasing dose from 600 twice daily up to 800 twice daily, however this would only be available in a liquid form, therefore the 750 mg twice daily dosing in tablet form was prescribed).  The patient remained seizure-free for the remainder of his hospitalization and was determined to be stable for discharge home with close outpatient follow-up.  Additionally, patient was found to have an HbA1c of 7.2%.  The  patient does not have any known history of type 2 diabetes.  For this he was started on Metformin 500 mg once daily.  Issues for Follow Up:  1. Stop cannabis oil on discharge. 2. Take Metformin 500mg  daily 3. Continue Depakote 750mg  twice daily for seizure prevention. 4. Follow up with in Clinic Thursday, October 25, 2019 at 4:00pm  [facility]  [address]  [place], [address] [place]  [phone]  Significant Procedures:  EEG - 6/4 - Normal findings  Significant Labs and Imaging:  Recent Labs  Lab 10/19/19 1418  WBC 5.1  HGB 15.7  HCT 47.7  PLT 309   Recent Labs  Lab 10/19/19 1418 10/20/19 0648  NA 137 141  K 4.1 3.9  CL 102 102  CO2 22 29  GLUCOSE 215* 125*  BUN 7 6  CREATININE 0.91 0.75  CALCIUM 9.7 9.3  ALKPHOS 46 35*  AST 37 30  ALT 49* 48*  ALBUMIN 3.8 3.5    HbA1c 7.2% (a new finding) TSH normal HIV nonreactive  Results/Tests Pending at Time of Discharge:  Urine Drug Screen   Discharge Medications:  Allergies as of 10/20/2019   No Known Allergies     Medication List    STOP taking these medications   NON FORMULARY     TAKE these medications   metFORMIN 500 MG 24 hr tablet Commonly known as: GLUCOPHAGE-XR Take 1 tablet (500 mg total) by mouth daily with breakfast. Start taking on: October 21, 2019   valproic acid 250 MG capsule Commonly known as: DEPAKENE Take 3 capsules (750 mg total) by mouth 2 (two) times daily.       Discharge Instructions: Please refer to Patient Instructions section of EMR for full  details.  Patient was counseled important signs and symptoms that should prompt return to medical care, changes in medications, dietary instructions, activity restrictions, and follow up appointments.   Follow-Up Appointments:  Thursday, October 25, 2019 at 4:00pm  [facility]  [place], [place] [address]  [place], Libby, DO 10/20/2019, 6:23 PM PGY-2, [place]

## 2019-10-20 NOTE — Progress Notes (Signed)
Pt was admitted for seizure like activity. He was on Criam (VPA) 600mg  BID with supply from . His VPA level was in range. He has been put on valproic acid 750mg  BID here due to formulation differences. No interaction issues at this point.   Continue VPA 750mg  PO BID  [place], PharmD, [place], AAHIVP, CPP Infectious Disease Pharmacist 10/20/2019 7:48 AM

## 2019-10-20 NOTE — Progress Notes (Signed)
Family Medicine Teaching Service Daily Progress Note Intern Pager: [phone]  Patient name: Ethan Edwards Medical record number: [id number] Date of birth: 15-Jul-1990 Age: 29 y.o. Gender: male  Primary Care Provider: No primary care provider on file. Consultants: Neurology Code Status: Partial (patient declines intubation)  Pt Overview and Major Events to Date:  10/19/2019-admitted for syncopal episode versus seizure  Assessment and Plan: Dana Dorner is a 29 y.o. male presenting with seizure-like activity.  PMH is significant for seizure disorder.  Seizure-like activity vs syncope Patient presented after passing out while at work, felt overheated, was reportedly postictal when EMS found him.  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  Takes valproic acid 600 mg twice daily.  MRI negative, EEG ordered but not completed.  EKG normal.  Differential includes breakthrough seizure (patient reports he has not had seizures in many years), vasovagal syncope, micturition syncope, dehydration, and orthostasis.  -Neurology consulted, appreciate recommendations: Possibility that Keppra is causing patient's increased somnolence, continue Keppra 750 twice daily and reevaluate dosing, await results of EEG. -Follow-up EEG -Check UDS, HIV, RPR -Seizure precautions -Orthostatic vitals -Continue every 4 neuro checks -PT/OT eval and treat  Type 2 diabetes, newly diagnosed No previous diagnosis of diabetes. CBG on admission 250, repeat this morning 129, patient has not yet received any insulin.  HbA1c 7.2%. -Diabetes education -SSI sensitive with meals  Elevated ALT Mildly elevated ALT on admission.  No obvious liver pathology based on history and physical exam.  No indication for further diagnostic testing at this time. -Monitor trend  FEN/GI: Regular diet PPx: Lovenox  Disposition: Can discharge home if EEG negative and no more seizure-like activity  (possibly today 6/5)  Subjective:  Patient seen resting in bed this morning, conversation was translated by Tonna Corner #[id number].  Continues to feel little bit sleepy, does not have any questions at this point in time, denies any other seizure-like activity.  Objective: Temp:  [98 F (36.7 C)-98.8 F (37.1 C)] 98.1 F (36.7 C) (06/05 0813) Pulse Rate:  [67-110] 71 (06/05 0813) Resp:  [10-36] 18 (06/05 0813) BP: (120-159)/(56-93) 130/84 (06/05 0813) SpO2:  [69 %-100 %] 97 % (06/05 0813) Physical Exam: General: Appears sleepy, nontoxic-appearing Cardiovascular: RRR, S1-S2 present, no murmurs Respiratory: CTA bilaterally, comfortable work of breathing Abdomen: Soft, nontender, normal bowel sounds Neuro: CN 2-12 grossly intact  Laboratory: Recent Labs  Lab 10/19/19 1418  WBC 5.1  HGB 15.7  HCT 47.7  PLT 309   Recent Labs  Lab 10/19/19 1418  NA 137  K 4.1  CL 102  CO2 22  BUN 7  CREATININE 0.91  CALCIUM 9.7  PROT 7.4  BILITOT 0.6  ALKPHOS 46  ALT 49*  AST 37  GLUCOSE 215*    Follow-up HIV, RPR, urine drug screen, and EEG  Imaging/Diagnostic Tests: MR BRAIN W CONTRAST (6/4): Unremarkable MRI appearance of the brain on the acquired sequences. No evidence of acute intracranial abnormality. No specific seizure focus is identified. T1 hypointense marrow signal within the visualized upper cervical spine. While this finding may be seen in the setting of a marrow infiltrative process, the most common causes are chronic anemia, smoking and obesity. Mild ethmoid sinus mucosal thickening. Small left maxillary sinus mucous retention cyst.    Dollene Cleveland, DO 10/20/2019, 8:38 AM PGY-2, [facility] FPTS Intern pager: [phone], text pages welcome

## 2019-10-20 NOTE — Progress Notes (Signed)
Pt was discharged waiting for his ride home, brother came up at shift change, d/c instructions was explained and given to pt's brother, pt was escorted out at 7:40pm.. [place], Loree Fee Efe

## 2019-10-20 NOTE — Discharge Instructions (Signed)
Take Metformin 500mg  daily  Take Depakote 750mg  twice daily.  STOP CANNABIS OIL!!  Follow up with [place] in Clinic Thursday, October 25, 2019 at 4:00pm [facility] [place], [place] [address] [phone]  Informacin bsica sobre la diabetes Diabetes Basics  La diabetes (diabetes mellitus) es una enfermedad de larga duracin (crnica). Se produce cuando el cuerpo no utiliza Occupational hygienist (glucosa) que se libera de los alimentos despus de comer. La diabetes puede deberse a uno de [employer] o a ambos:  El pncreas no produce suficiente cantidad de una hormona llamada insulina.  El cuerpo no reacciona de forma normal a la insulina que produce. La insulina permite que ciertos azcares (glucosa) ingresen a las clulas del cuerpo. Esto le proporciona energa. Si tiene diabetes, los azcares no pueden ingresar a las clulas. Esto produce un aumento del nivel de Dispensing optician (hiperglucemia). Sigue estas instrucciones en tu casa: Cmo se trata la diabetes? Es posible que tenga que administrarse insulina u otros medicamentos para la diabetes todos los [place] para mantener el nivel de Location manager en la sangre equilibrado. Adminstrese los medicamentos para la diabetes todos los [employer] se lo haya indicado el mdico. Haga una lista de los medicamentos para la diabetes aqu: METFORMIN 500mg  daily  Qu debo saber acerca del nivel bajo de azcar en la sangre? Un nivel bajo de azcar en la sangre se denomina hipoglucemia. Este cuadro ocurre cuando el nivel de azcar en la sangre es igual o menor que 70mg /dl (3,20mmol/l). Entre los sntomas, se pueden incluir los siguientes:  Sentir: ? [place]. ? Preocupacin o nervios (ansiedad). ? Sudoracin y [employer]. ? Confusin. ? Mareos. ? Somnolencia. ? Ganas de vomitar (nuseas).  Tener: ? Latidos cardacos acelerados. ? Dolor de [place]. ? Cambios en la visin. ? Hormigueo y falta de sensibilidad  (entumecimiento) alrededor de la boca, los labios o la [place]. ? Movimientos espasmdicos que no puede controlar (convulsiones).  Dificultades para hacer lo siguiente: ? Moverse (coordinacin). ? Dormir. ? Desmayos. ? Molestarse con facilidad (irritabilidad). ?  Tratamiento del nivel bajo de azcar en la sangre Para tratar un nivel bajo de azcar en la sangre, ingiera un alimento o una bebida azucarada de inmediato. Si puede pensar con claridad y tragar de manera segura, siga la regla 15/15, que consiste en lo siguiente:  Consuma 15gramos de un hidrato de carbono de accin rpida (carbohidrato). Hable con su mdico acerca de cunto debera consumir.  Algunos hidratos de carbono de accin rpida son: ? Comprimidos de azcar (pastillas de glucosa). Consuma 3o 4pastillas de glucosa. ? De 6 a 8unidades de caramelos duros. ? De 4 a 6onzas (de 120 a 147ml) de jugo de frutas. ? De 4 a 6onzas (de 120 a 147ml) de refresco comn (no diettico). ? 1 cucharada (50ml) de miel o azcar.  Contrlese el nivel de azcar en la sangre 17minutos despus de ingerir el hidrato de carbono.  Si el nivel de azcar en la sangre todava es igual o menor que 70mg /dl (3,31mmol/l), ingiera nuevamente 15gramos de un hidrato de carbono.  Si el nivel de azcar en la sangre no supera los 70mg /dl (3,70mmol/l) despus de 3intentos, solicite ayuda de inmediato.  Ingiera una comida o una colacin en el transcurso de 1hora despus de que el nivel de azcar en la sangre se haya normalizado. Tratamiento del nivel muy bajo de azcar en la sangre Si el nivel de azcar en la sangre es igual o menor que  54mg /dl (45mmol/l), significa que est muy bajo (hipoglucemia grave). Esto es 1m. No espere a ver si los sntomas desaparecen. Solicite atencin mdica de inmediato. Comunquese con el servicio de emergencias de su localidad (911 en los [place]). No conduzca por sus propios medios Radio broadcast assistant  hospital. Preguntas para hacerle al mdico  Es necesario que me rena con [employer] en el cuidado de la diabetes?  Qu equipos necesitar para cuidarme en casa?  Qu medicamentos para la diabetes necesito? Cundo debo tomarlos?  Con qu frecuencia debo controlar mi nivel de azcar en la sangre?  A qu nmero puedo llamar si tengo preguntas?  Cundo es mi prxima cita con el mdico?  Dnde puedo encontrar un grupo de apoyo para las personas con diabetes? Dnde buscar ms informacin  American Diabetes Association (Asociacin Estadounidense de la Diabetes): www.diabetes.org  American Association of Diabetes Educators (Asociacin Estadounidense de Instructores para el Cuidado de la Diabetes): www.diabeteseducator.org/patient-resources Comunquese con un mdico si:  El nivel de azcar en la sangre es igual o mayor que 240mg /dl (IT trainer) durante 2das seguidos.  Ha estado enfermo o ha tenido fiebre durante 2das o ms y no mejora.  Tiene alguno de estos problemas durante ms de 6horas: ? No puede comer ni beber. ? Siente malestar estomacal (nuseas). ? Vomita. ? Presenta heces lquidas (diarrea). Solicite ayuda inmediatamente si:  El nivel de azcar en la sangre est por debajo de 54mg /dl (65mmol/l).  Se siente confundida.  Tiene dificultad para hacer lo siguiente: ? Pensar con claridad. ? La respiracin. Resumen  La diabetes (diabetes mellitus) es una enfermedad de larga duracin (crnica). Se produce cuando el cuerpo no utiliza 65,7QION/GE (glucosa) que se libera de los alimentos despus de la digestin.  Aplquese la insulina y tome los medicamentos para la diabetes como se lo hayan indicado.  Contrlese el nivel de azcar en la sangre todos los [place], con la frecuencia que le hayan indicado.  Concurra a todas las visitas de 1m se lo haya indicado el mdico. Esto es importante.

## 2019-10-20 NOTE — Progress Notes (Signed)
EEG complete - results pending 

## 2019-10-21 LAB — RPR: RPR Ser Ql: NONREACTIVE

## 2019-10-24 NOTE — Progress Notes (Signed)
No show   Kenny Stern, MD Family Medicine Residency   

## 2019-10-25 ENCOUNTER — Ambulatory Visit (INDEPENDENT_AMBULATORY_CARE_PROVIDER_SITE_OTHER): Payer: Self-pay | Admitting: Family Medicine

## 2019-10-25 DIAGNOSIS — Z5329 Procedure and treatment not carried out because of patient's decision for other reasons: Secondary | ICD-10-CM

## 2021-06-27 IMAGING — MR MR HEAD W/ CM
4 of 8 series · 19 of 48 positions shown · IV contrast (Gadavist)
Comparison: Brain MRI performed earlier the same day 10/19/2019.

CLINICAL DATA: Seizure, abnormal neuro exam.

EXAM:
MRI HEAD WITH CONTRAST
TECHNIQUE: Multiplanar, multiecho pulse sequences of the brain and surrounding
structures were obtained with intravenous contrast.
CONTRAST:  10mL GADAVIST GADOBUTROL 1 MMOL/ML IV SOLN

[Series 10: T2 · coronal · 3.0mm · 0.28mm/px · 6 of 32 slices shown]
[im 1/32]
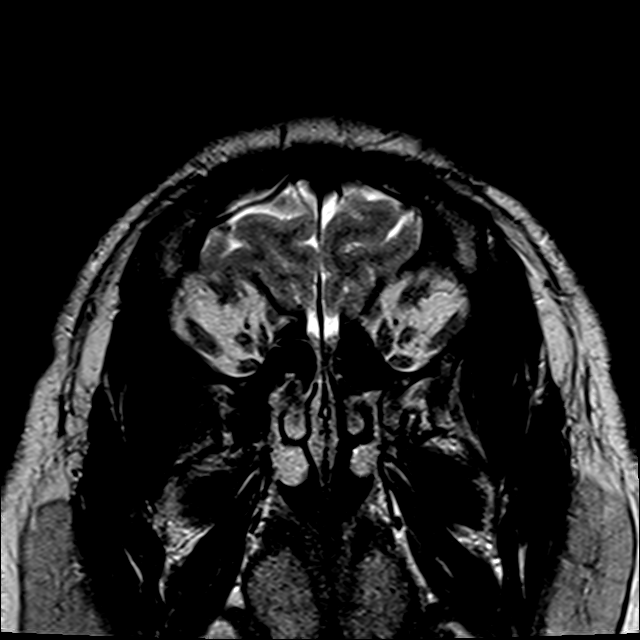
[im 6/32]
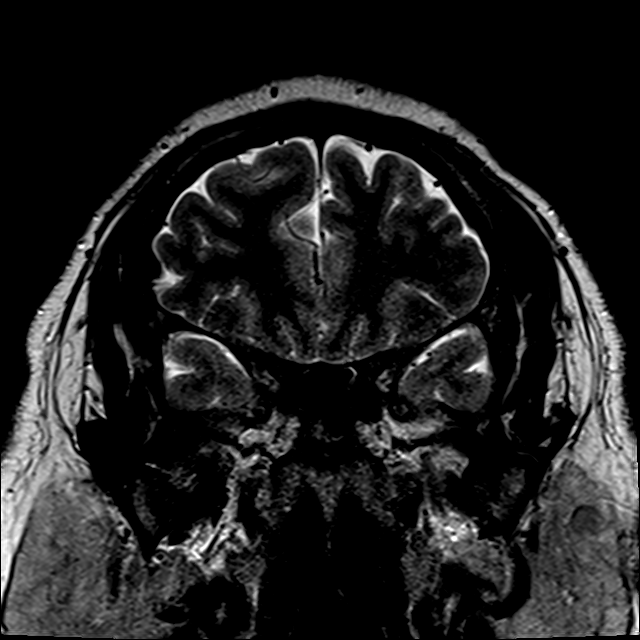
[im 11/32]
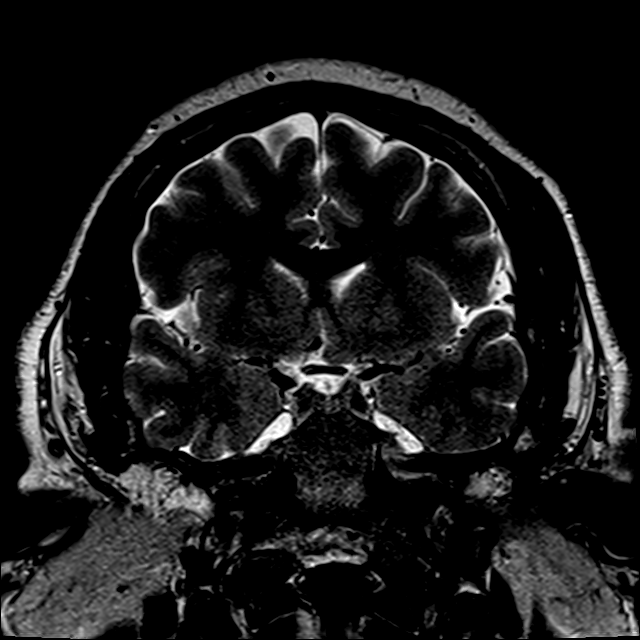
[im 16/32]
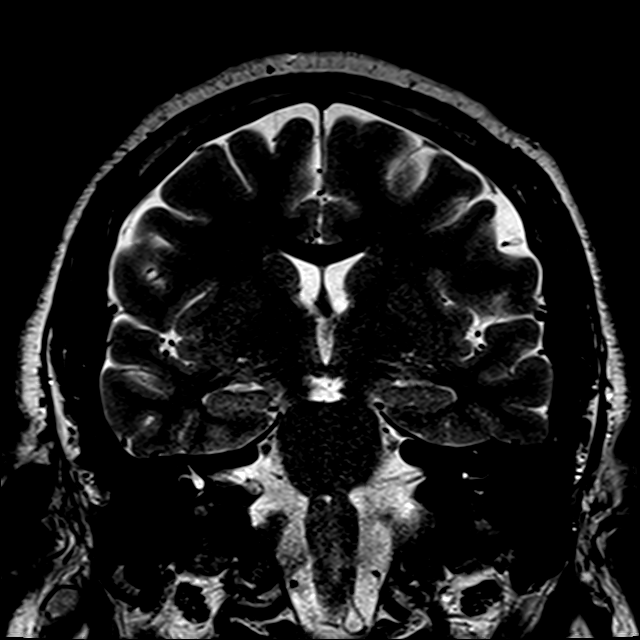
[im 21/32]
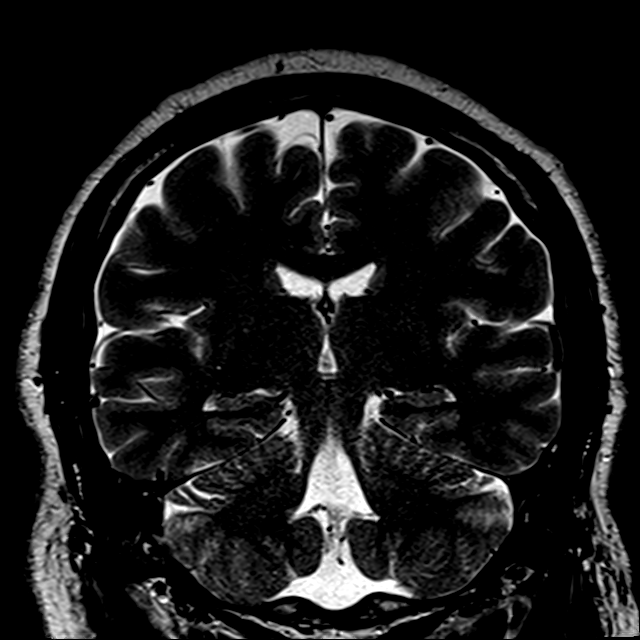
[im 26/32]
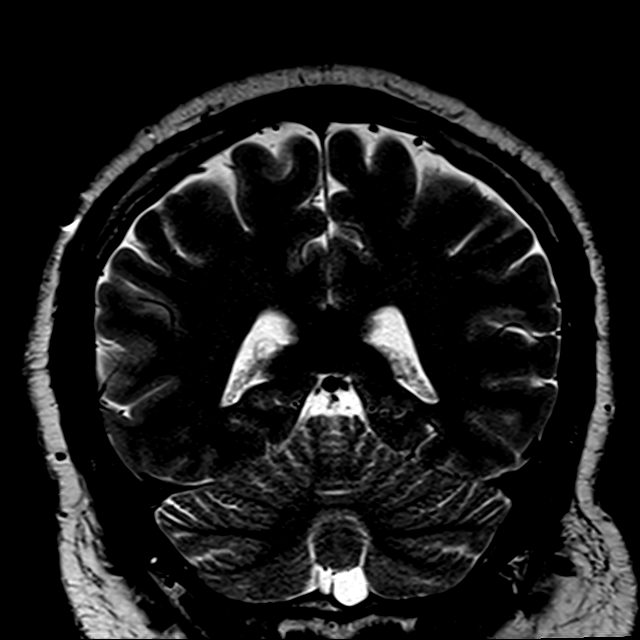

[Series 11: FLAIR · coronal · 3.0mm · 0.56mm/px · 4 of 25 slices shown]
[im 1/25]
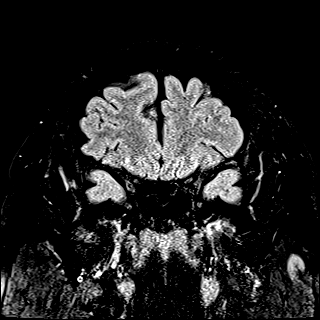
[im 9/25]
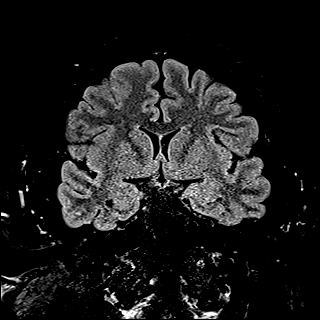
[im 17/25]
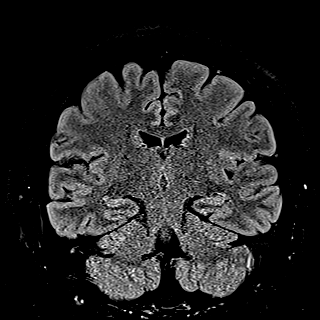
[im 25/25]
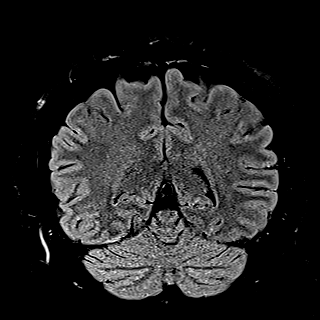

[Series 15: T1 post-contrast · coronal · 5.0mm · 0.38mm/px · 5 of 29 slices shown (1 of 2)]
[im 1/29]
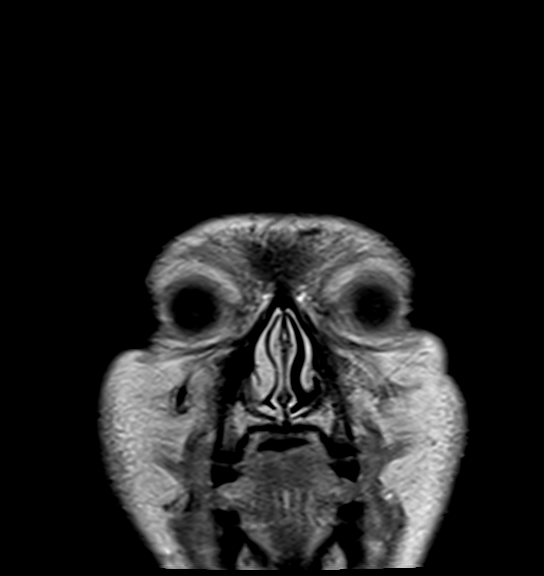
[im 8/29]
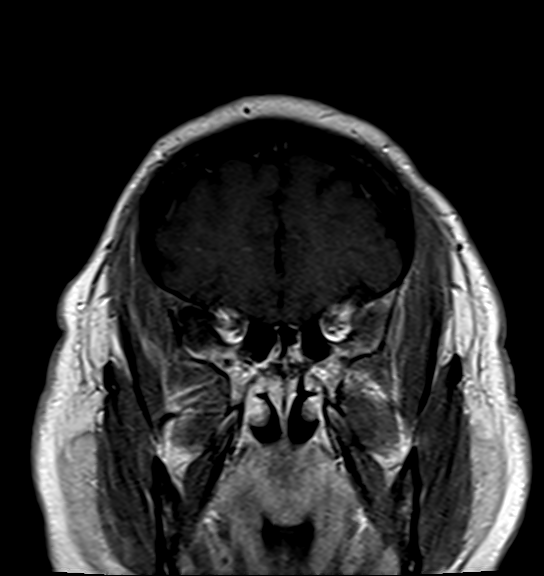
[im 15/29]
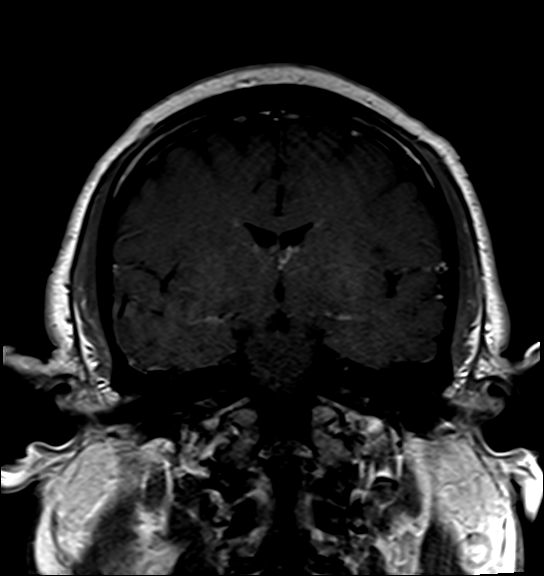
[im 22/29]
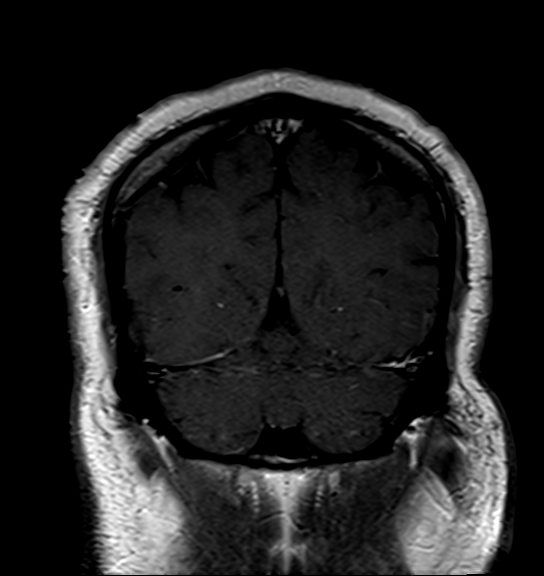
[im 29/29]
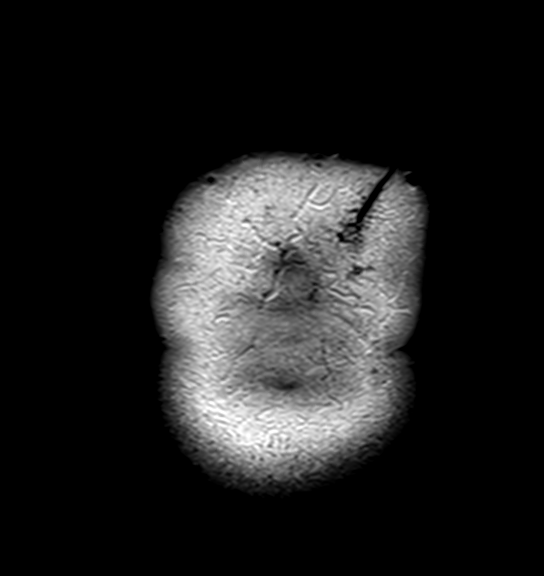

[Series 16: T1 post-contrast · sagittal · 5.0mm · 0.72mm/px · 4 of 25 slices shown (2 of 2)]
[im 1/25]
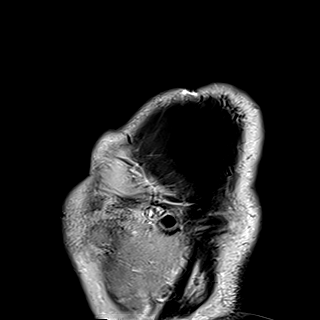
[im 9/25]
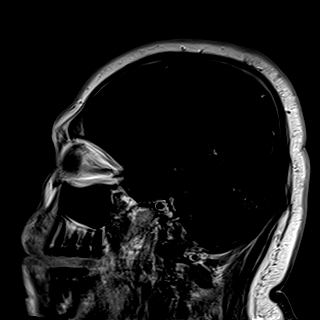
[im 17/25]
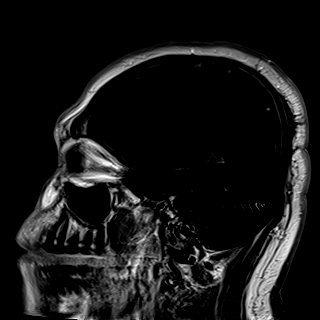
[im 25/25]
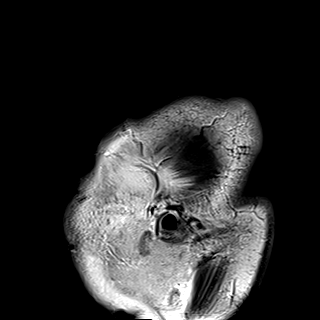

[19 of 48 positions shown; findings below may reference images not displayed]

FINDINGS: Brain:

A limited protocol brain MRI is performed as a follow-up to the
incomplete study performed earlier the same day. The following
sequences were performed on the current examination: Sagittal T1
weighted sequence, coronal T2 weighted sequence oriented
perpendicular to the hippocampi, coronal T2/FLAIR sequence oriented
perpendicular to the long axis of the hippocampi, axial T1 weighted
precontrast sequence, coronal T2 weighted sequence, as well as
axial, coronal and sagittal T1 weighted postcontrast sequences.

No focal parenchymal signal abnormality is appreciated on the
acquired sequences.

The hippocampi are symmetric in size and signal.

No abnormal intracranial enhancement is identified.

No evidence of intracranial mass is identified.

No extra-axial fluid collection is identified.

No midline shift.

Vascular: Expected proximal arterial flow voids.

Skull and upper cervical spine: No focal marrow lesion. T1
hypointense marrow signal within the visualized upper cervical
spine.

Sinuses/Orbits: Visualized orbits show no acute finding. Mild
ethmoid sinus mucosal thickening. Small left maxillary sinus mucous
retention cyst. No significant mastoid effusion.
IMPRESSION: Limited protocol brain MRI performed as a follow-up to the
incomplete study performed earlier the same day, as described.

Unremarkable MRI appearance of the brain on the acquired sequences.
No evidence of acute intracranial abnormality.

No specific seizure focus is identified.

T1 hypointense marrow signal within the visualized upper cervical
spine. While this finding may be seen in the setting of a marrow
infiltrative process, the most common causes are chronic anemia,
smoking and obesity.

Mild ethmoid sinus mucosal thickening. Small left maxillary sinus
mucous retention cyst.

## 2021-06-27 IMAGING — MR MR HEAD W/O CM
8 series · 37 of 48 positions shown · non-contrast
Comparison: None.

CLINICAL DATA: Seizure. History of neurocysticercosis with new
headache and seizure.

EXAM:
MRI HEAD WITHOUT CONTRAST
TECHNIQUE: Multiplanar, multiecho pulse sequences of the brain and surrounding
structures were obtained without intravenous contrast.

[Series 2: DWI · axial · 3.0mm · 0.94mm/px · z∈[-46,+101]mm · 9 of 100 slices shown (1 of 2)]
[im 1/100]
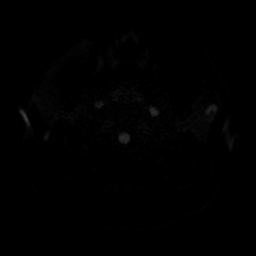
[im 19/100]
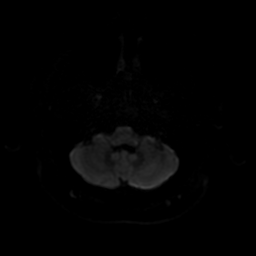
[im 28/100]
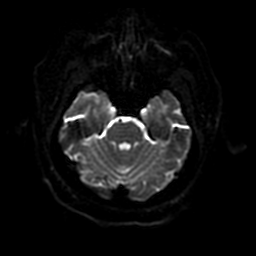
[im 46/100]
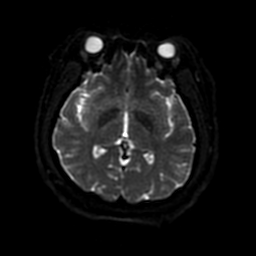
[im 55/100]
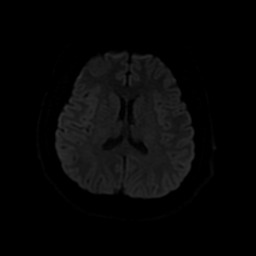
[im 73/100]
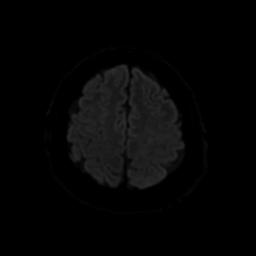
[im 82/100]
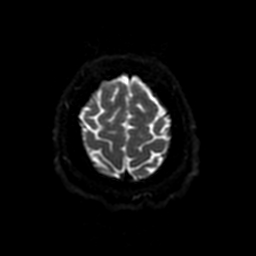
[im 91/100]
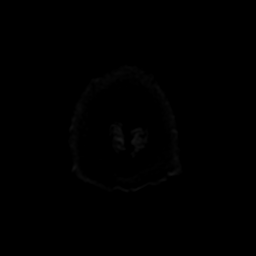
[im 100/100]
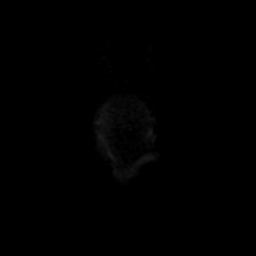

[Series 3: FLAIR · sagittal · 5.0mm · 0.47mm/px · 3 of 29 slices shown (1 of 2)]
[im 1/29]
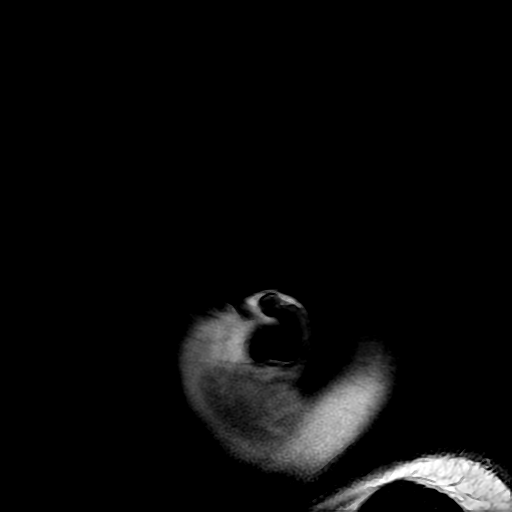
[im 15/29]
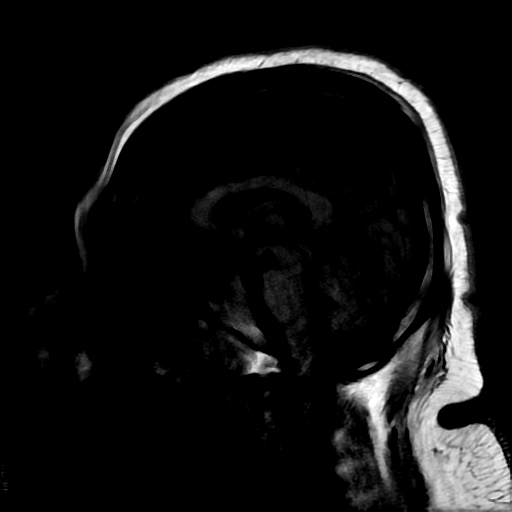
[im 29/29]
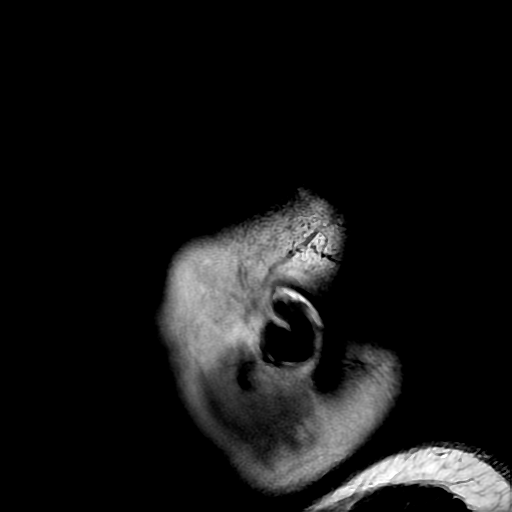

[Series 4: DWI · coronal · 5.0mm · 1.09mm/px · 7 of 68 slices shown (2 of 2)]
[im 1/68]
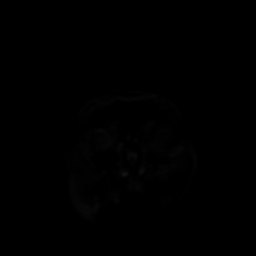
[im 12/68]
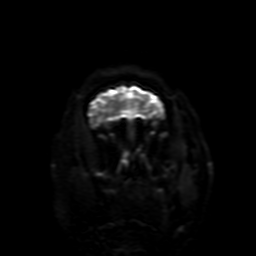
[im 23/68]
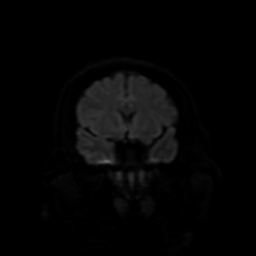
[im 34/68]
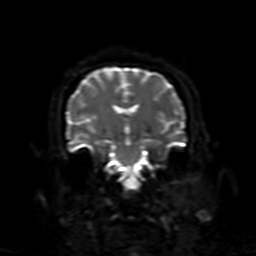
[im 45/68]
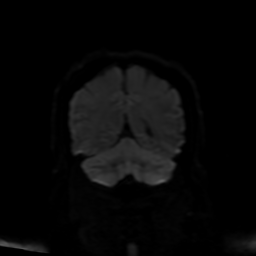
[im 56/68]
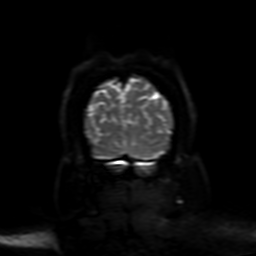
[im 68/68]
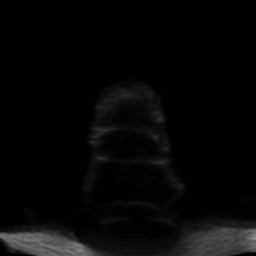

[Series 5: T2 · axial · 5.0mm · 0.47mm/px · z∈[-67,+95]mm · 3 of 28 slices shown]
[im 1/28]
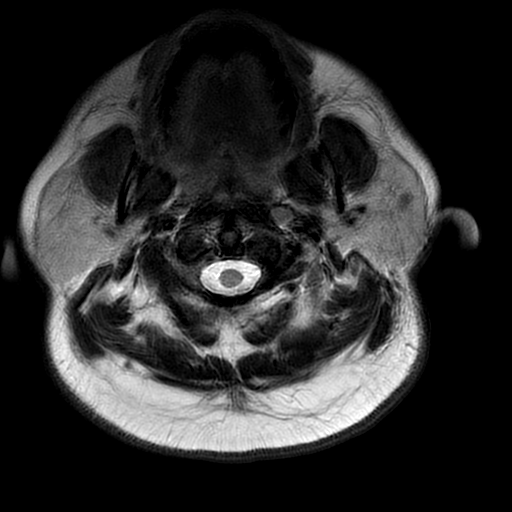
[im 14/28]
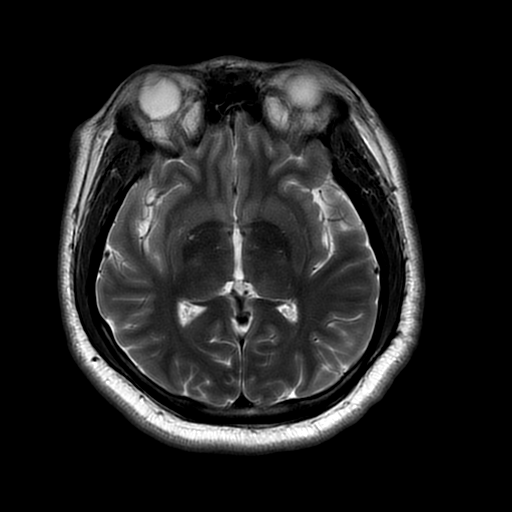
[im 28/28]
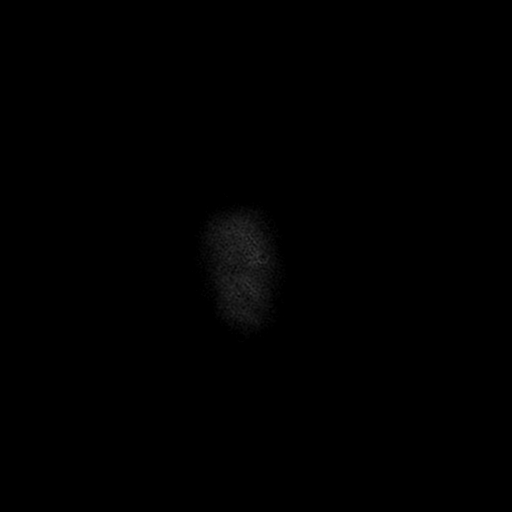

[Series 6: FLAIR · axial · 3.0mm · 0.41mm/px · z∈[-56,+100]mm · 3 of 27 slices shown (2 of 2)]
[im 1/27]
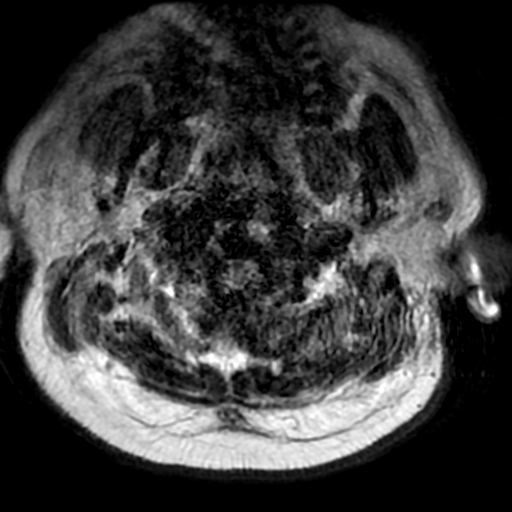
[im 14/27]
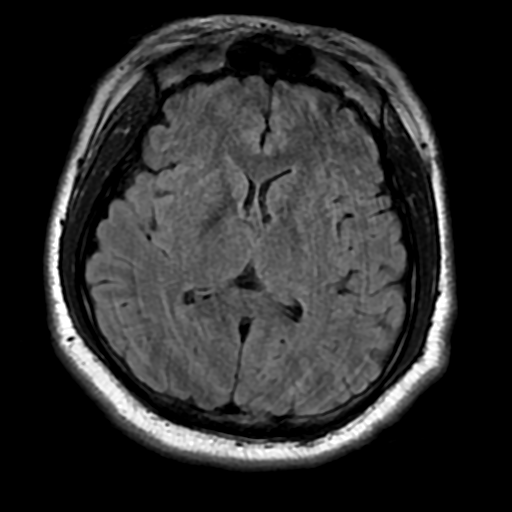
[im 27/27]
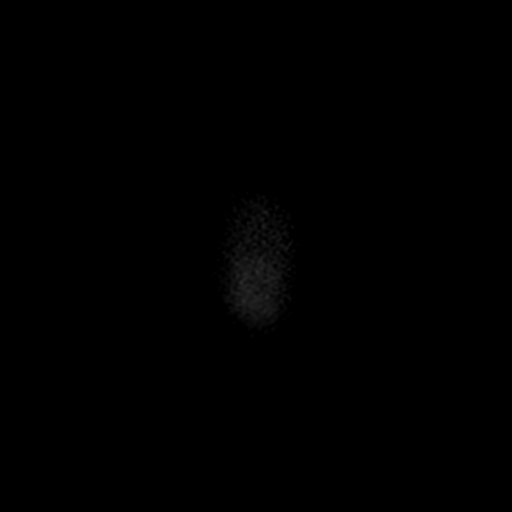

[Series 7: SWI · axial · 3.0mm · 0.47mm/px · z∈[-55,-10]mm · 3 of 104 slices shown]
[im 1/104]
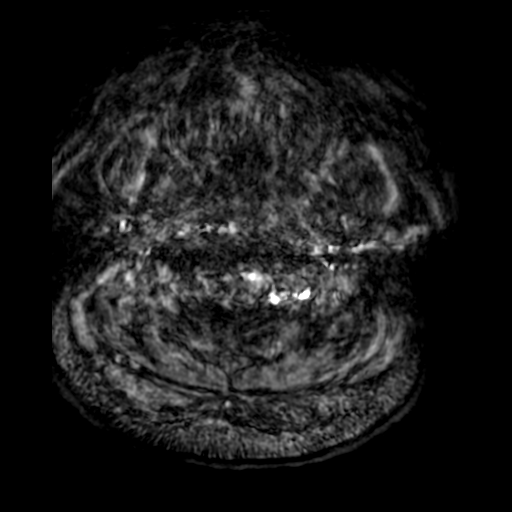
[im 21/104]
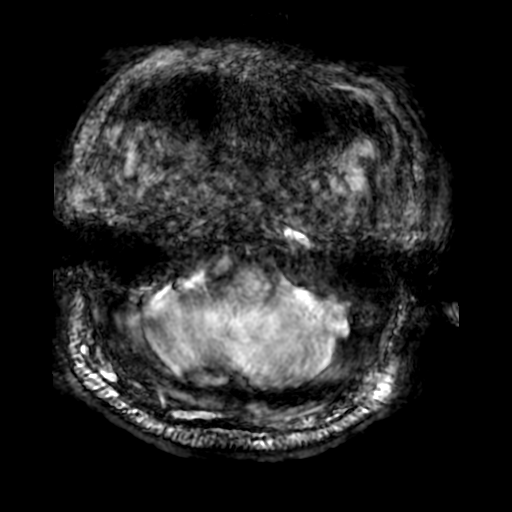
[im 31/104]
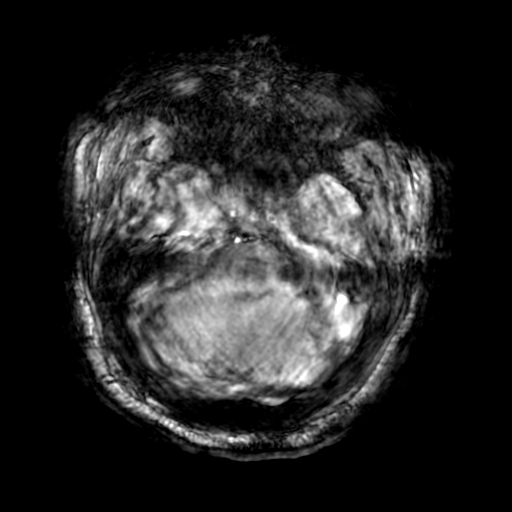

[Series 250: ADC · axial · 3.0mm · 0.94mm/px · z∈[-46,+101]mm · 5 of 50 slices shown (1 of 2)]
[im 1/50]
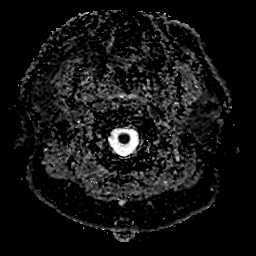
[im 13/50]
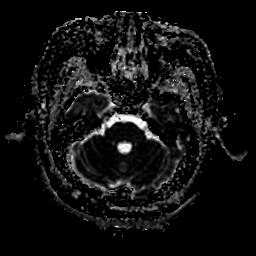
[im 25/50]
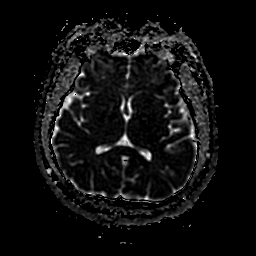
[im 37/50]
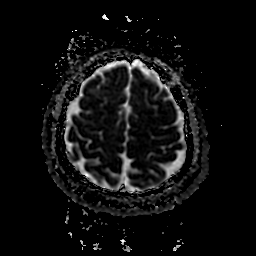
[im 50/50]
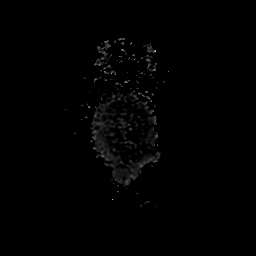

[Series 450: ADC · coronal · 5.0mm · 1.09mm/px · 4 of 34 slices shown (2 of 2)]
[im 1/34]
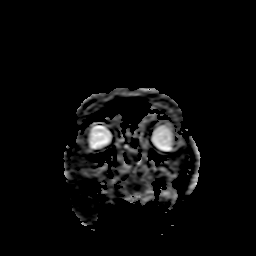
[im 12/34]
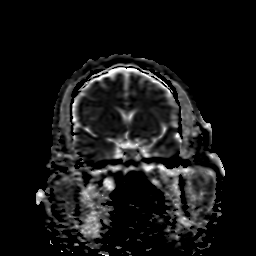
[im 23/34]
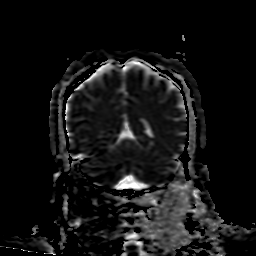
[im 34/34]
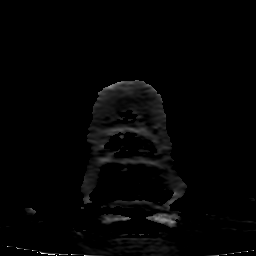

[37 of 48 positions shown; findings below may reference images not displayed]

FINDINGS: Incomplete study. Patient had difficulty holding still and then was
not able to complete the study.

Diffusion-weighted imaging is diagnostic and negative for acute
infarct.

Ventricle size normal. No infarct hemorrhage or mass. No edema
identified.

Normal arterial flow voids.

Mild mucosal edema paranasal sinuses.  Negative orbit.
IMPRESSION: Incomplete study which is degraded by motion. Allowing for this no
significant abnormality detected in the brain.
